# Patient Record
Sex: Female | Born: 1965 | Race: Black or African American | Hispanic: No
Health system: Southern US, Community
[De-identification: ages and names within clinical notes are randomized; demographics above are authoritative.]

---

## 1998-01-16 ENCOUNTER — Encounter: Admission: RE | Admit: 1998-01-16 | Discharge: 1998-01-16 | Payer: Self-pay | Admitting: Family Medicine

## 1998-01-20 ENCOUNTER — Encounter: Admission: RE | Admit: 1998-01-20 | Discharge: 1998-01-20 | Payer: Self-pay | Admitting: Family Medicine

## 1998-02-20 ENCOUNTER — Encounter: Admission: RE | Admit: 1998-02-20 | Discharge: 1998-02-20 | Payer: Self-pay | Admitting: Family Medicine

## 1998-02-23 ENCOUNTER — Emergency Department (HOSPITAL_COMMUNITY): Admission: EM | Admit: 1998-02-23 | Discharge: 1998-02-23 | Payer: Self-pay | Admitting: Internal Medicine

## 1998-10-06 ENCOUNTER — Encounter: Payer: Self-pay | Admitting: Family Medicine

## 1998-10-06 ENCOUNTER — Ambulatory Visit (HOSPITAL_COMMUNITY): Admission: RE | Admit: 1998-10-06 | Discharge: 1998-10-06 | Payer: Self-pay | Admitting: Family Medicine

## 1998-10-06 ENCOUNTER — Encounter: Admission: RE | Admit: 1998-10-06 | Discharge: 1998-10-06 | Payer: Self-pay | Admitting: Sports Medicine

## 1998-12-23 ENCOUNTER — Encounter: Payer: Self-pay | Admitting: Emergency Medicine

## 1998-12-23 ENCOUNTER — Emergency Department (HOSPITAL_COMMUNITY): Admission: EM | Admit: 1998-12-23 | Discharge: 1998-12-23 | Payer: Self-pay | Admitting: Emergency Medicine

## 1999-01-16 ENCOUNTER — Encounter: Admission: RE | Admit: 1999-01-16 | Discharge: 1999-01-16 | Payer: Self-pay | Admitting: Family Medicine

## 1999-02-05 ENCOUNTER — Encounter: Admission: RE | Admit: 1999-02-05 | Discharge: 1999-02-05 | Payer: Self-pay | Admitting: Family Medicine

## 1999-07-16 ENCOUNTER — Encounter: Admission: RE | Admit: 1999-07-16 | Discharge: 1999-07-16 | Payer: Self-pay | Admitting: Family Medicine

## 1999-08-02 ENCOUNTER — Encounter: Admission: RE | Admit: 1999-08-02 | Discharge: 1999-08-02 | Payer: Self-pay | Admitting: Family Medicine

## 1999-08-14 ENCOUNTER — Other Ambulatory Visit: Admission: RE | Admit: 1999-08-14 | Discharge: 1999-08-14 | Payer: Self-pay | Admitting: *Deleted

## 2000-02-19 ENCOUNTER — Encounter: Admission: RE | Admit: 2000-02-19 | Discharge: 2000-02-19 | Payer: Self-pay | Admitting: Sports Medicine

## 2001-03-12 ENCOUNTER — Other Ambulatory Visit: Admission: RE | Admit: 2001-03-12 | Discharge: 2001-03-12 | Payer: Self-pay | Admitting: Obstetrics and Gynecology

## 2002-04-19 ENCOUNTER — Other Ambulatory Visit: Admission: RE | Admit: 2002-04-19 | Discharge: 2002-04-19 | Payer: Self-pay | Admitting: Obstetrics and Gynecology

## 2003-07-12 ENCOUNTER — Other Ambulatory Visit: Admission: RE | Admit: 2003-07-12 | Discharge: 2003-07-12 | Payer: Self-pay | Admitting: Obstetrics and Gynecology

## 2007-07-10 ENCOUNTER — Emergency Department (HOSPITAL_COMMUNITY): Admission: EM | Admit: 2007-07-10 | Discharge: 2007-07-10 | Payer: Self-pay | Admitting: Family Medicine

## 2007-11-11 ENCOUNTER — Encounter: Admission: RE | Admit: 2007-11-11 | Discharge: 2007-11-11 | Payer: Self-pay | Admitting: Obstetrics and Gynecology

## 2009-02-17 ENCOUNTER — Encounter: Admission: RE | Admit: 2009-02-17 | Discharge: 2009-02-17 | Payer: Self-pay | Admitting: Family Medicine

## 2009-10-04 IMAGING — US US PELVIS COMPLETE
1 series · 13 of 25 positions shown · non-contrast
Comparison: None

CLINICAL DATA: Abdominal pelvic pain and bloating.  Question
fibroids.

TRANSABDOMINAL AND TRANSVAGINAL ULTRASOUND OF PELVIS
TECHNIQUE: Both transabdominal and transvaginal ultrasound
examinations of the pelvis were performed including evaluation of
the uterus, ovaries, adnexal regions, and pelvic cul-de-sac.

[Series 1: us pelvis complete · 0.24mm/px · 13 of 89 slices shown]
[im 1/89]
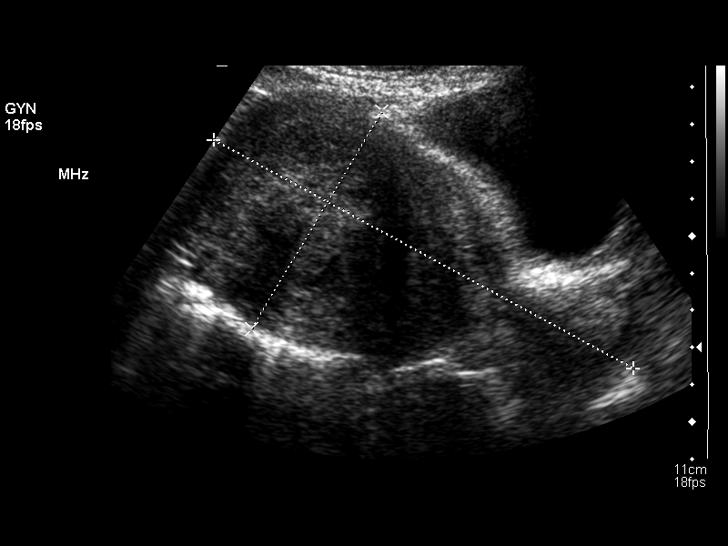
[im 8/89]
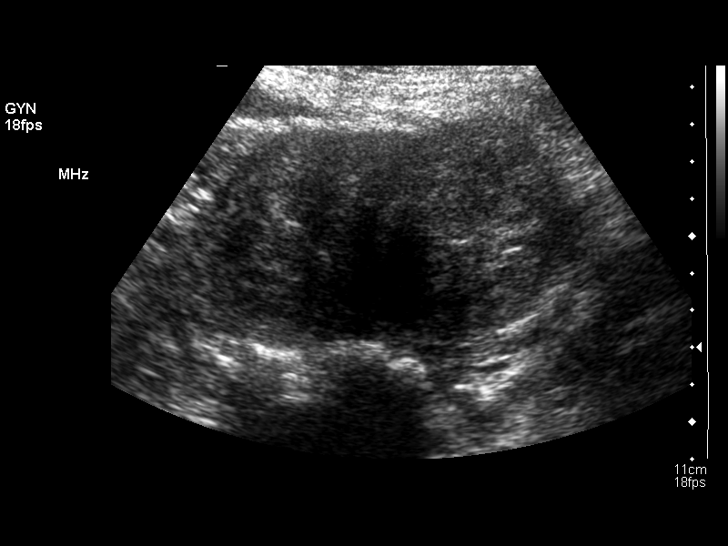
[im 15/89]
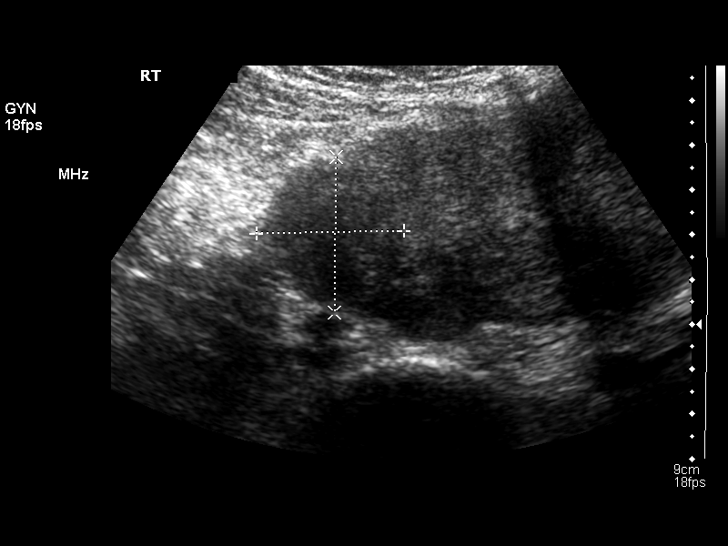
[im 23/89]
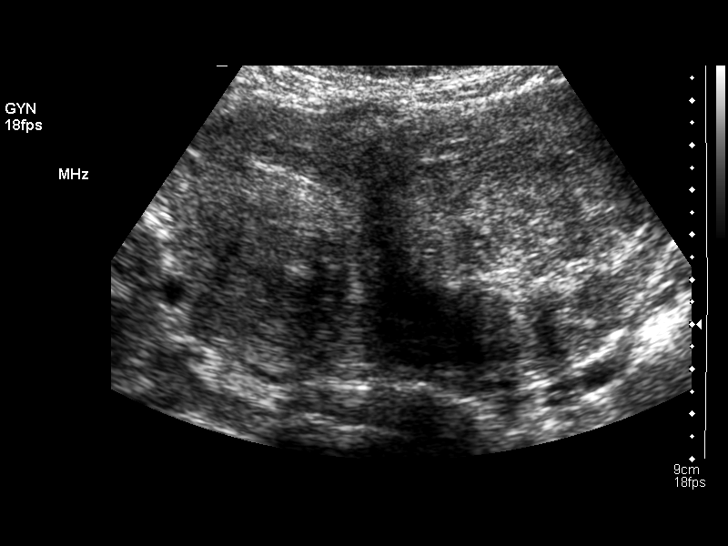
[im 30/89]
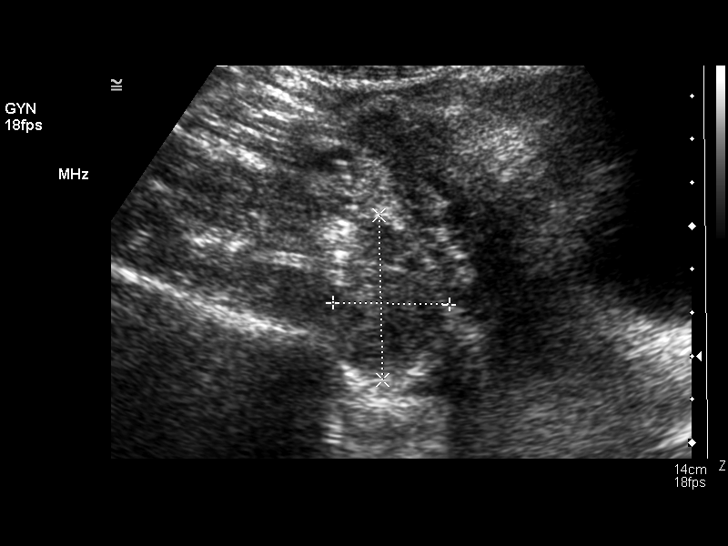
[im 37/89]
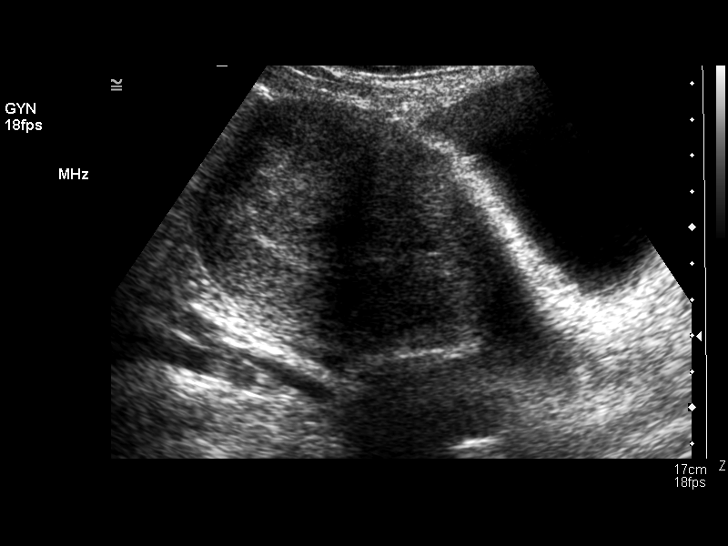
[im 45/89]
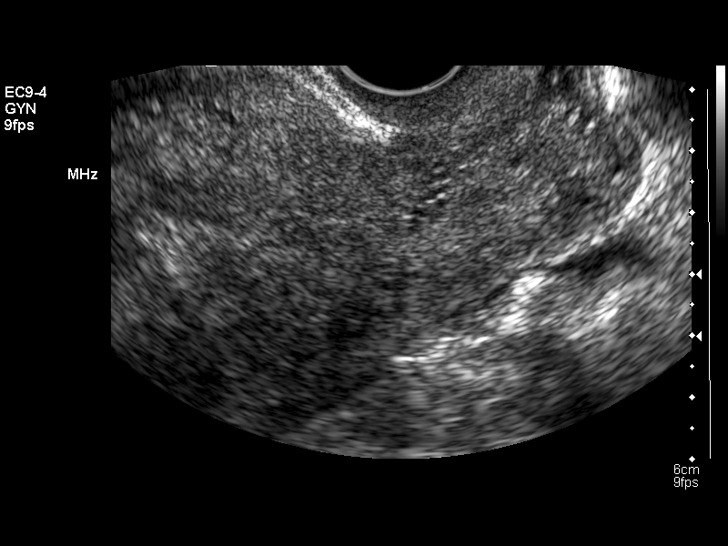
[im 52/89]
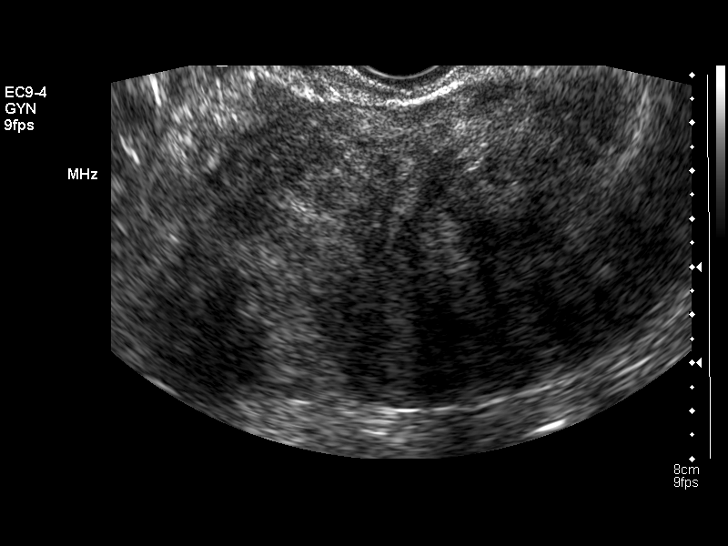
[im 59/89]
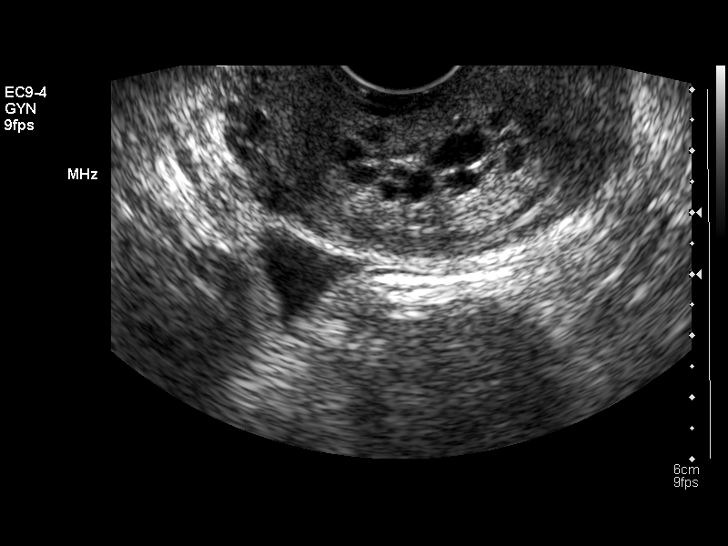
[im 67/89]
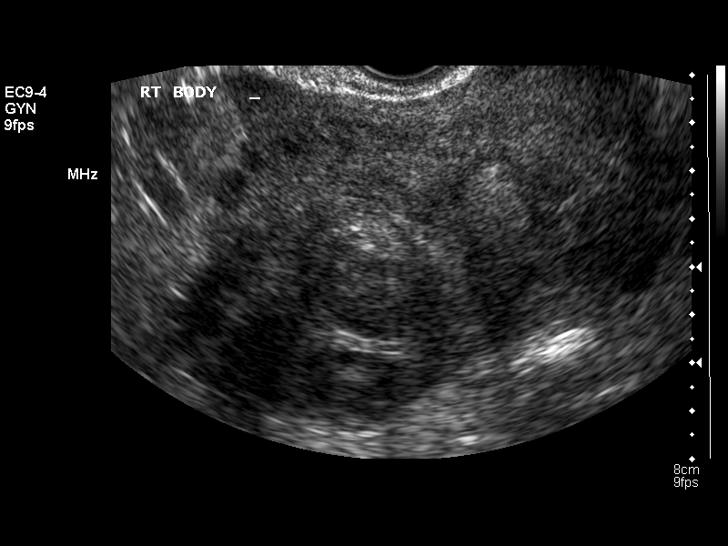
[im 74/89]
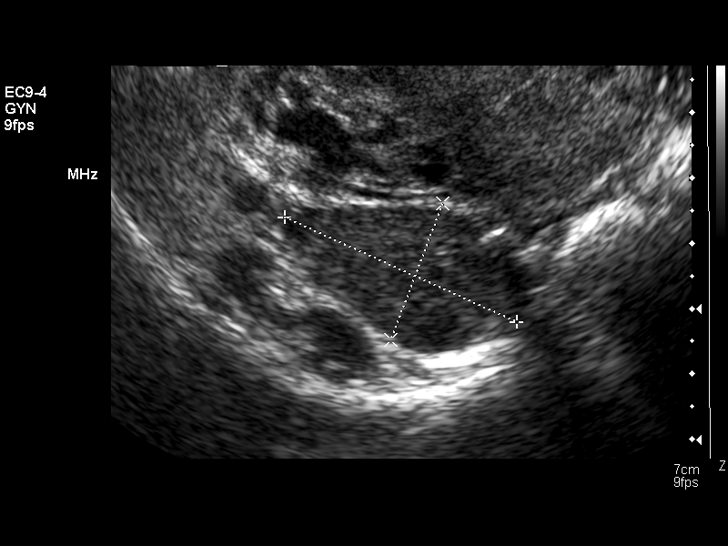
[im 81/89]
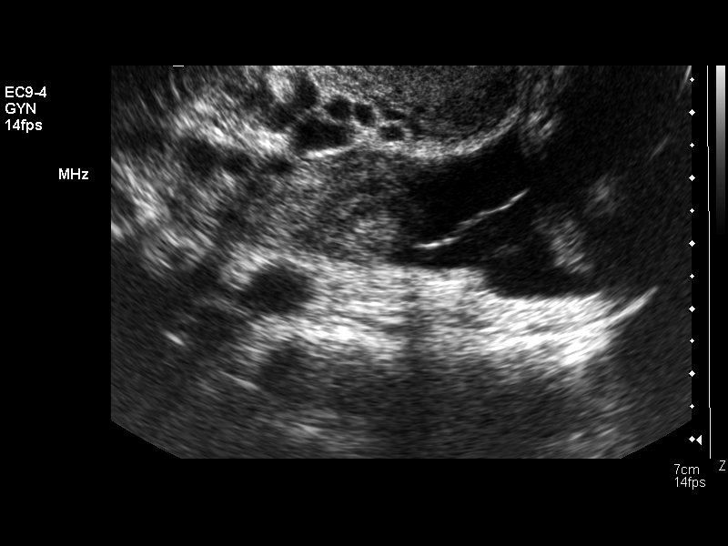
[im 89/89]
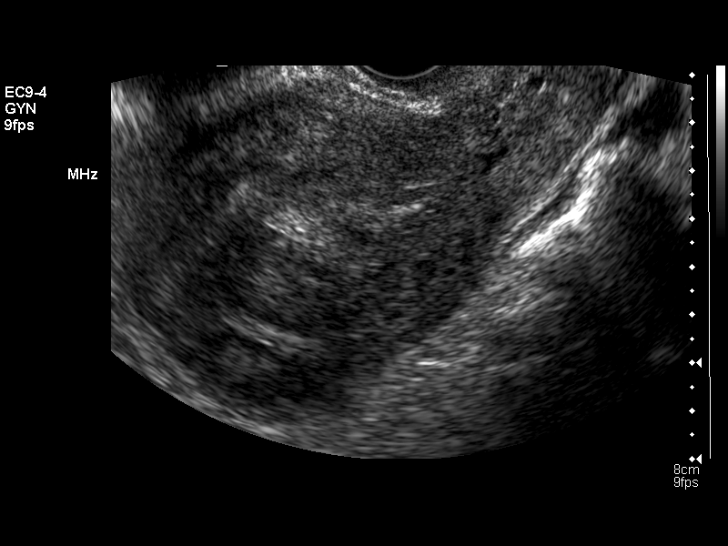

[13 of 25 positions shown; findings below may reference images not displayed]

FINDINGS: Uterus 11.4 x 6.8 x 9.9 cm.  Numerous uterine masses most
consistent with fibroids.  A right-sided fundal lesion measures
x 3.5 x 3.4 cm.  This is either intramural or subserosal.

A more inferior right-sided uterine body lesion measures 4.5 x
x 4.0 cm.  This is likely intramural.

A lower uterine body lesion measures 2.5 x 2.4 x 2.3 cm on the
right and is likely intramural. A left side lesion is the largest
lesion at 6.6 x 6.3 x 6.5 cm.

Endometrium normal, 4 mm.

Right Ovary 4.4 x 2.2 x 3.9 cm.  Uterine follicles within.

Left Ovary not visualized  due to overlying bowel gas.

Other Findings:  Trace free pelvic fluid primarily adjacent the
right ovary.  This may be physiologic.
IMPRESSION: 1.  Numerous uterine masses most consist with fibroids.
2.  Lack of visualization of the left ovary.
3. Trace free pelvic fluid is likely physiologic.

## 2009-12-27 ENCOUNTER — Inpatient Hospital Stay (HOSPITAL_COMMUNITY): Admission: RE | Admit: 2009-12-27 | Discharge: 2009-12-29 | Payer: Self-pay | Admitting: Internal Medicine

## 2009-12-27 ENCOUNTER — Encounter (INDEPENDENT_AMBULATORY_CARE_PROVIDER_SITE_OTHER): Payer: Self-pay | Admitting: Obstetrics & Gynecology

## 2010-07-15 ENCOUNTER — Encounter: Payer: Self-pay | Admitting: *Deleted

## 2010-09-09 LAB — BASIC METABOLIC PANEL
CO2: 29 mEq/L (ref 19–32)
Calcium: 8.6 mg/dL (ref 8.4–10.5)
Chloride: 105 mEq/L (ref 96–112)
Glucose, Bld: 79 mg/dL (ref 70–99)
Sodium: 137 mEq/L (ref 135–145)

## 2010-09-09 LAB — PREGNANCY, URINE: Preg Test, Ur: NEGATIVE

## 2010-09-09 LAB — CBC
HCT: 32.6 % — ABNORMAL LOW (ref 36.0–46.0)
HCT: 36.8 % (ref 36.0–46.0)
Hemoglobin: 10.8 g/dL — ABNORMAL LOW (ref 12.0–15.0)
Hemoglobin: 12.3 g/dL (ref 12.0–15.0)
MCH: 31.4 pg (ref 26.0–34.0)
MCHC: 33.2 g/dL (ref 30.0–36.0)
MCHC: 33.5 g/dL (ref 30.0–36.0)
MCV: 93.6 fL (ref 78.0–100.0)
Platelets: 264 K/uL (ref 150–400)
RBC: 3.45 MIL/uL — ABNORMAL LOW (ref 3.87–5.11)
RBC: 3.93 MIL/uL (ref 3.87–5.11)
RDW: 13.9 % (ref 11.5–15.5)
WBC: 5.3 K/uL (ref 4.0–10.5)

## 2010-09-09 LAB — SURGICAL PCR SCREEN: Staphylococcus aureus: NEGATIVE

## 2011-03-15 LAB — POCT URINALYSIS DIP (DEVICE)
Bilirubin Urine: NEGATIVE
Glucose, UA: NEGATIVE
Nitrite: NEGATIVE
Operator id: 247071

## 2011-03-15 LAB — POCT PREGNANCY, URINE
Operator id: 247071
Preg Test, Ur: NEGATIVE

## 2014-01-11 ENCOUNTER — Other Ambulatory Visit: Payer: Self-pay

## 2014-01-11 NOTE — Telephone Encounter (Signed)
Erroneous Encounter... Incorrect pt.
